# Patient Record
Sex: Female | Born: 1995 | Race: Black or African American | Hispanic: No | Marital: Single | Smoking: Never smoker
Health system: Southern US, Community
[De-identification: ages and names within clinical notes are randomized; demographics above are authoritative.]

---
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
## 2019-08-05 ENCOUNTER — Encounter (HOSPITAL_COMMUNITY): Payer: Self-pay

## 2019-08-05 ENCOUNTER — Other Ambulatory Visit: Payer: Self-pay

## 2019-08-05 ENCOUNTER — Emergency Department (HOSPITAL_COMMUNITY)
Admission: EM | Admit: 2019-08-05 | Discharge: 2019-08-05 | Disposition: A | Discharge status: Home / Self Care | Payer: Medicaid Other | Attending: Emergency Medicine | Admitting: Emergency Medicine

## 2019-08-05 ENCOUNTER — Emergency Department (HOSPITAL_COMMUNITY): Payer: Medicaid Other

## 2019-08-05 DIAGNOSIS — Y999 Unspecified external cause status: Secondary | ICD-10-CM | POA: Insufficient documentation

## 2019-08-05 DIAGNOSIS — Y9389 Activity, other specified: Secondary | ICD-10-CM | POA: Insufficient documentation

## 2019-08-05 DIAGNOSIS — M25511 Pain in right shoulder: Secondary | ICD-10-CM

## 2019-08-05 DIAGNOSIS — Y92810 Car as the place of occurrence of the external cause: Secondary | ICD-10-CM | POA: Insufficient documentation

## 2019-08-05 DIAGNOSIS — S4991XA Unspecified injury of right shoulder and upper arm, initial encounter: Secondary | ICD-10-CM | POA: Insufficient documentation

## 2019-08-05 DIAGNOSIS — S4990XA Unspecified injury of shoulder and upper arm, unspecified arm, initial encounter: Secondary | ICD-10-CM

## 2019-08-05 DIAGNOSIS — X500XXA Overexertion from strenuous movement or load, initial encounter: Secondary | ICD-10-CM | POA: Insufficient documentation

## 2019-08-05 MED ORDER — OXYCODONE-ACETAMINOPHEN 5-325 MG PO TABS
1.0000 | ORAL_TABLET | Freq: Once | ORAL | Status: AC
  Administered 2019-08-05: 1 via ORAL
  Filled 2019-08-05: qty 1

## 2019-08-05 MED ORDER — METHOCARBAMOL 500 MG PO TABS: 500.0000 mg | ORAL_TABLET | Freq: Two times a day (BID) | ORAL | 0 refills | Status: AC

## 2019-08-05 MED ORDER — IBUPROFEN 600 MG PO TABS: 600.0000 mg | ORAL_TABLET | Freq: Four times a day (QID) | ORAL | 0 refills | Status: AC | PRN

## 2019-08-05 MED ORDER — LIDOCAINE 5 % EX PTCH: 1.0000 | MEDICATED_PATCH | CUTANEOUS | 0 refills | Status: AC

## 2019-08-05 MED ORDER — IBUPROFEN 200 MG PO TABS
600.0000 mg | ORAL_TABLET | Freq: Once | ORAL | Status: AC
  Administered 2019-08-05: 08:00:00 600 mg via ORAL
  Filled 2019-08-05: qty 3

## 2019-08-05 NOTE — Discharge Instructions (Addendum)
You have been seen today for a shoulder injury. There were no acute abnormalities on the x-rays, including no sign of fracture or dislocation, however, there could be injuries to the soft tissues, such as the ligaments or tendons that are not seen on xrays. There could also be what are called occult fractures that are small fractures not seen on xray. Antiinflammatory medications: Take 600 mg of ibuprofen every 6 hours or 440 mg (over the counter dose) to 500 mg (prescription dose) of naproxen every 12 hours for the next 3 days. After this time, these medications may be used as needed for pain. Take these medications with food to avoid upset stomach. Choose only one of these medications, do not take them together. Acetaminophen (generic for Tylenol): Should you continue to have additional pain while taking the ibuprofen or naproxen, you may add in acetaminophen as needed. Your daily total maximum amount of acetaminophen from all sources should be limited to 4000mg /day for persons without liver problems, or 2000mg /day for those with liver problems. Methocarbamol: Methocarbamol (generic for Robaxin) is a muscle relaxer and can help relieve stiff muscles or muscle spasms.  Do not drive or perform other dangerous activities while taking this medication as it can cause drowsiness as well as changes in reaction time and judgement. Ice: May apply ice to the area over the next 24 hours for 15 minutes at a time to reduce swelling. Elevation: Keep the extremity elevated as often as possible to reduce pain and inflammation. Support: Wear the sling for support and comfort. Wear this until pain resolves. Be sure to take your arm out of the sling multiple times a day to prevent a condition called frozen shoulder. Follow up: We recommend follow-up with the orthopedic surgeon within the next couple weeks.  Shoulders that have dislocated multiple times become much more likely to dislocate in the future.  Many times the  shoulders require surgical repair. Return: Return to the ED for numbness, weakness, increasing pain, overall worsening symptoms, loss of function, or if symptoms are not improving, you have tried to follow up with the orthopedic specialist, and have been unable to do so.  For prescription assistance, may try using prescription discount sites or apps, such as goodrx.com

## 2019-08-05 NOTE — ED Provider Notes (Signed)
[facility] DEPT Provider Note   CSN: [id number] Arrival date & time: 08/05/19  0715     History Chief Complaint  Patient presents with  . Shoulder Pain    Alexa Harrington is a 24 y.o. female.  HPI      Alexa Harrington is a 24 y.o. female, with a history of reported right shoulder dislocation, presenting to the ED with right shoulder pain beginning shortly prior to arrival.  Patient states she was bending over, reaching into the backseat of her car with her right arm when she began to feel pain in the right shoulder.  She states it feels as though the shoulder felt like it popped out of place, but may have popped back into place.  She endorses history of previous shoulder dislocation. Pain is described as a soreness and a tightness, moderate to severe, in the right shoulder and trapezius.  Denies neck pain, numbness, weakness, chest pain, shortness of breath, or any other complaints.     History reviewed. No pertinent past medical history.  There are no problems to display for this patient.   History reviewed. No pertinent surgical history.   OB History   No obstetric history on file.     Family History  Problem Relation Age of Onset  . Sleep apnea Father     Social History   Tobacco Use  . Smoking status: Never Smoker  . Smokeless tobacco: Never Used  Substance Use Topics  . Alcohol use: Never  . Drug use: Never    Home Medications Prior to Admission medications   Medication Sig Start Date End Date Taking? Authorizing Provider  ibuprofen (ADVIL) 600 MG tablet Take 1 tablet (600 mg total) by mouth every 6 (six) hours as needed. 08/05/19   Danna Sewell C, PA-C  lidocaine (LIDODERM) 5 % Place 1 patch onto the skin daily. Remove & Discard patch within 12 hours or as directed by MD 08/05/19   Haedyn Ancrum C, PA-C  methocarbamol (ROBAXIN) 500 MG tablet Take 1 tablet (500 mg total) by mouth 2 (two) times daily. 08/05/19   Jalayla Chrismer, Helane Gunther, PA-C     Allergies    Patient has no known allergies.  Review of Systems   Review of Systems  Musculoskeletal: Positive for arthralgias.  Neurological: Negative for weakness and numbness.    Physical Exam Updated Vital Signs BP 117/67 (BP Location: Left Arm)   Pulse 95   Temp 98.2 F (36.8 C) (Oral)   Resp 16   Ht 5\' 4"  (1.626 m)   Wt 62.1 kg   LMP 08/05/2019   SpO2 100%   BMI 23.52 kg/m   Physical Exam Vitals and nursing note reviewed.  Constitutional:      General: She is not in acute distress.    Appearance: She is well-developed. She is not diaphoretic.  HENT:     Head: Normocephalic and atraumatic.  Eyes:     Conjunctiva/sclera: Conjunctivae normal.  Cardiovascular:     Rate and Rhythm: Normal rate and regular rhythm.     Pulses: Normal pulses.          Radial pulses are 2+ on the right side.  Pulmonary:     Effort: Pulmonary effort is normal.  Musculoskeletal:     Cervical back: Neck supple.     Comments: Tenderness to the right posterior shoulder, right upper back musculature, and right trapezius.  No deformity, swelling, or instability noted. Upon initial exam, there is a surprising amount  of spontaneous movement to the right shoulder that patient is able to demonstrate.  She is observed to be able to pull her right arm out of the sleeve of her shirt without assistance, she is able to reach across her body and pull at the left arm of her shirt. She individually uses both arms to direct her hands through the sleeves of her gown.  Skin:    General: Skin is warm and dry.     Coloration: Skin is not pale.  Neurological:     Mental Status: She is alert.     Comments: Sensation grossly intact to light touch through each of the nerve distributions of the bilateral upper extremities. Abduction and adduction of the fingers intact against resistance. Grip strength equal bilaterally. Supination and pronation intact against resistance. Strength 5/5 through the cardinal  directions of the bilateral wrists. Strength 5/5 with flexion and extension of the bilateral elbows. Patient can touch the thumb to each one of the fingertips without difficulty.  Patient can hold the "OK" sign against resistance.  Psychiatric:        Behavior: Behavior normal.     ED Results / Procedures / Treatments   Labs (all labs ordered are listed, but only abnormal results are displayed) Labs Reviewed - No data to display  EKG None  Radiology DG Shoulder Right Port  Result Date: 08/05/2019 CLINICAL DATA:  Possible dislocation EXAM: PORTABLE RIGHT SHOULDER COMPARISON:  None. FINDINGS: Alignment is anatomic.  No acute fracture.  Joint space is preserved IMPRESSION: No acute fracture or malalignment Electronically Signed   By: Guadlupe Spanish M.D.   On: 08/05/2019 08:13    Procedures Procedures (including critical care time)  Medications Ordered in ED Medications  oxyCODONE-acetaminophen (PERCOCET/ROXICET) 5-325 MG per tablet 1 tablet (1 tablet Oral Given 08/05/19 0814)  ibuprofen (ADVIL) tablet 600 mg (600 mg Oral Given 08/05/19 0947)    ED Course  I have reviewed the triage vital signs and the nursing notes.  Pertinent labs & imaging results that were available during my care of the patient were reviewed by me and considered in my medical decision making (see chart for details).    MDM Rules/Calculators/A&P                      Patient presents with pain to the right shoulder.  She states she feels as though her shoulder dislocated, but she may have reduced it prior to arrival. She has no evidence of neurovascular compromise. I personally reviewed and interpreted her x-rays.  She has no evidence of dislocation or other acute abnormality on x-ray. She has adequate range of motion in the shoulder.  She was advised to follow-up with orthopedics on this matter. The patient was given instructions for home care as well as return precautions. Patient voices understanding of  these instructions, accepts the plan, and is comfortable with discharge.    Final Clinical Impression(s) / ED Diagnoses Final diagnoses:  Shoulder injury  Acute pain of right shoulder    Rx / DC Orders ED Discharge Orders         Ordered    methocarbamol (ROBAXIN) 500 MG tablet  2 times daily     08/05/19 0856    ibuprofen (ADVIL) 600 MG tablet  Every 6 hours PRN     08/05/19 0856    lidocaine (LIDODERM) 5 %  Every 24 hours     08/05/19 0856  Anselm Pancoast, PA-C 08/05/19 0901    Tilden Fossa, MD 08/05/19 [phone]

## 2019-08-05 NOTE — ED Triage Notes (Signed)
Patient states she was getting something out of her car and it feels like it is dislocated." Patient states this has happened years prior . Patient reports doing a lot of lifting at work.

## 2020-09-27 IMAGING — DX DG SHOULDER 2+V PORT*R*
2 series · 2 of 2 positions shown · non-contrast
Comparison: None.

CLINICAL DATA: Possible dislocation

EXAM:
PORTABLE RIGHT SHOULDER

[shoulder ap]
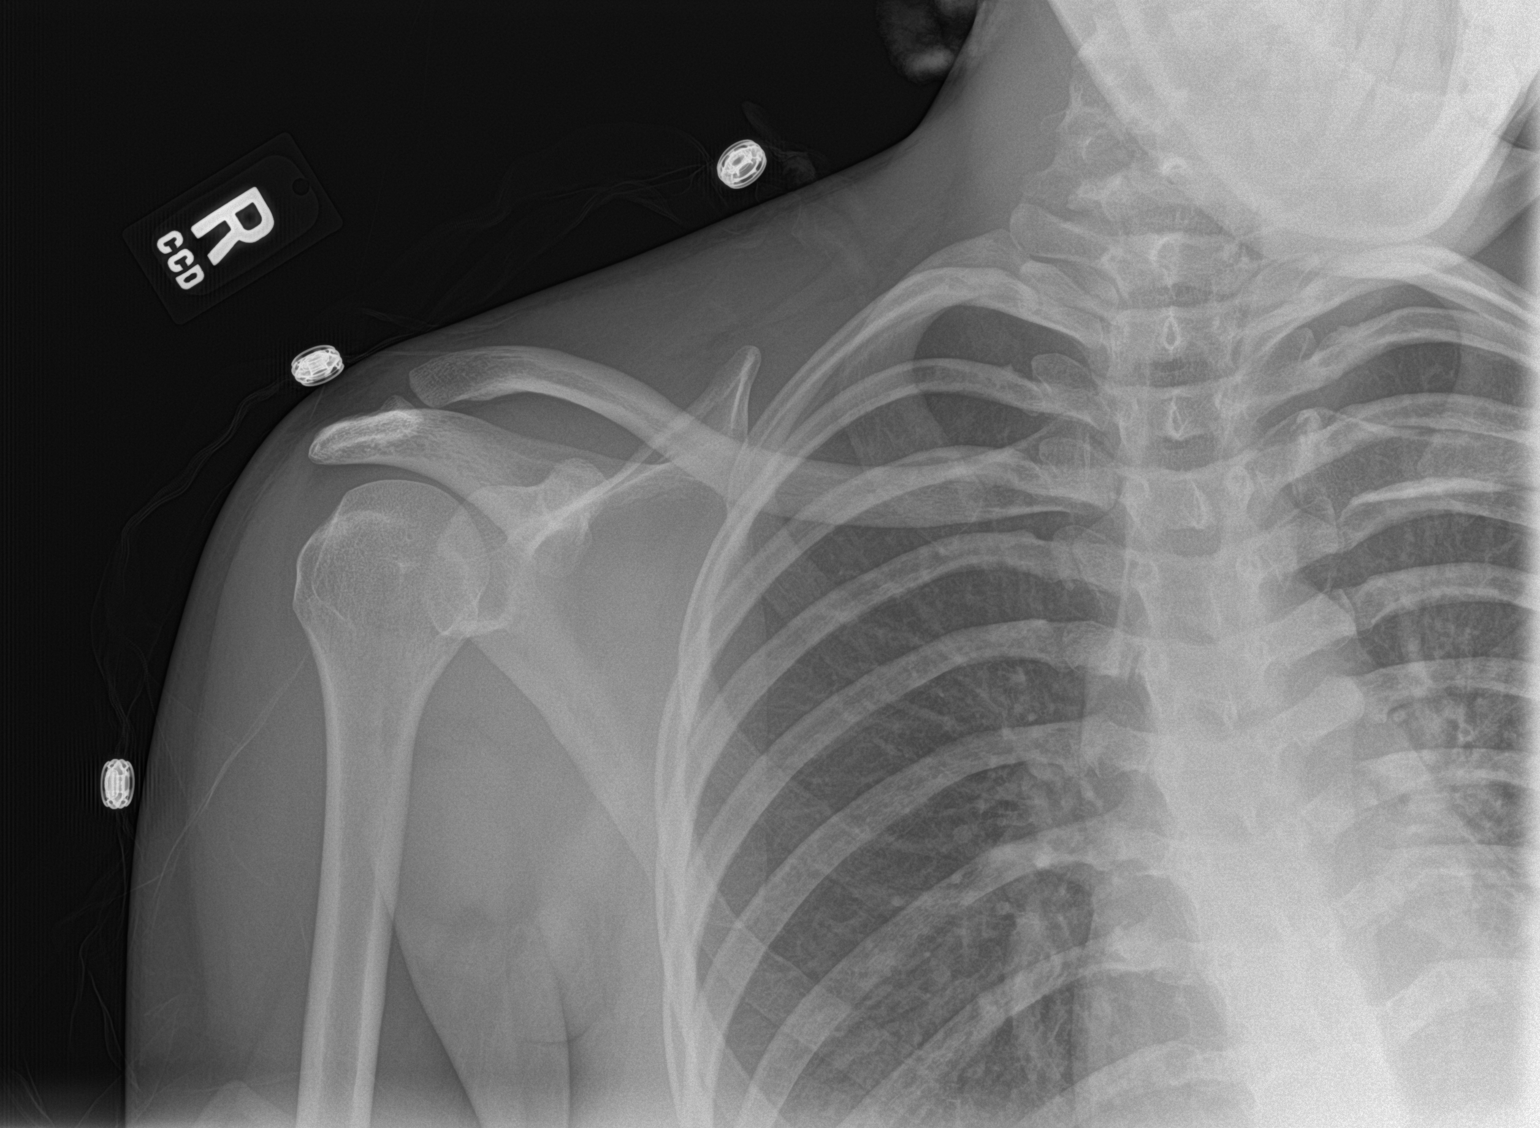

[shoulder obl]
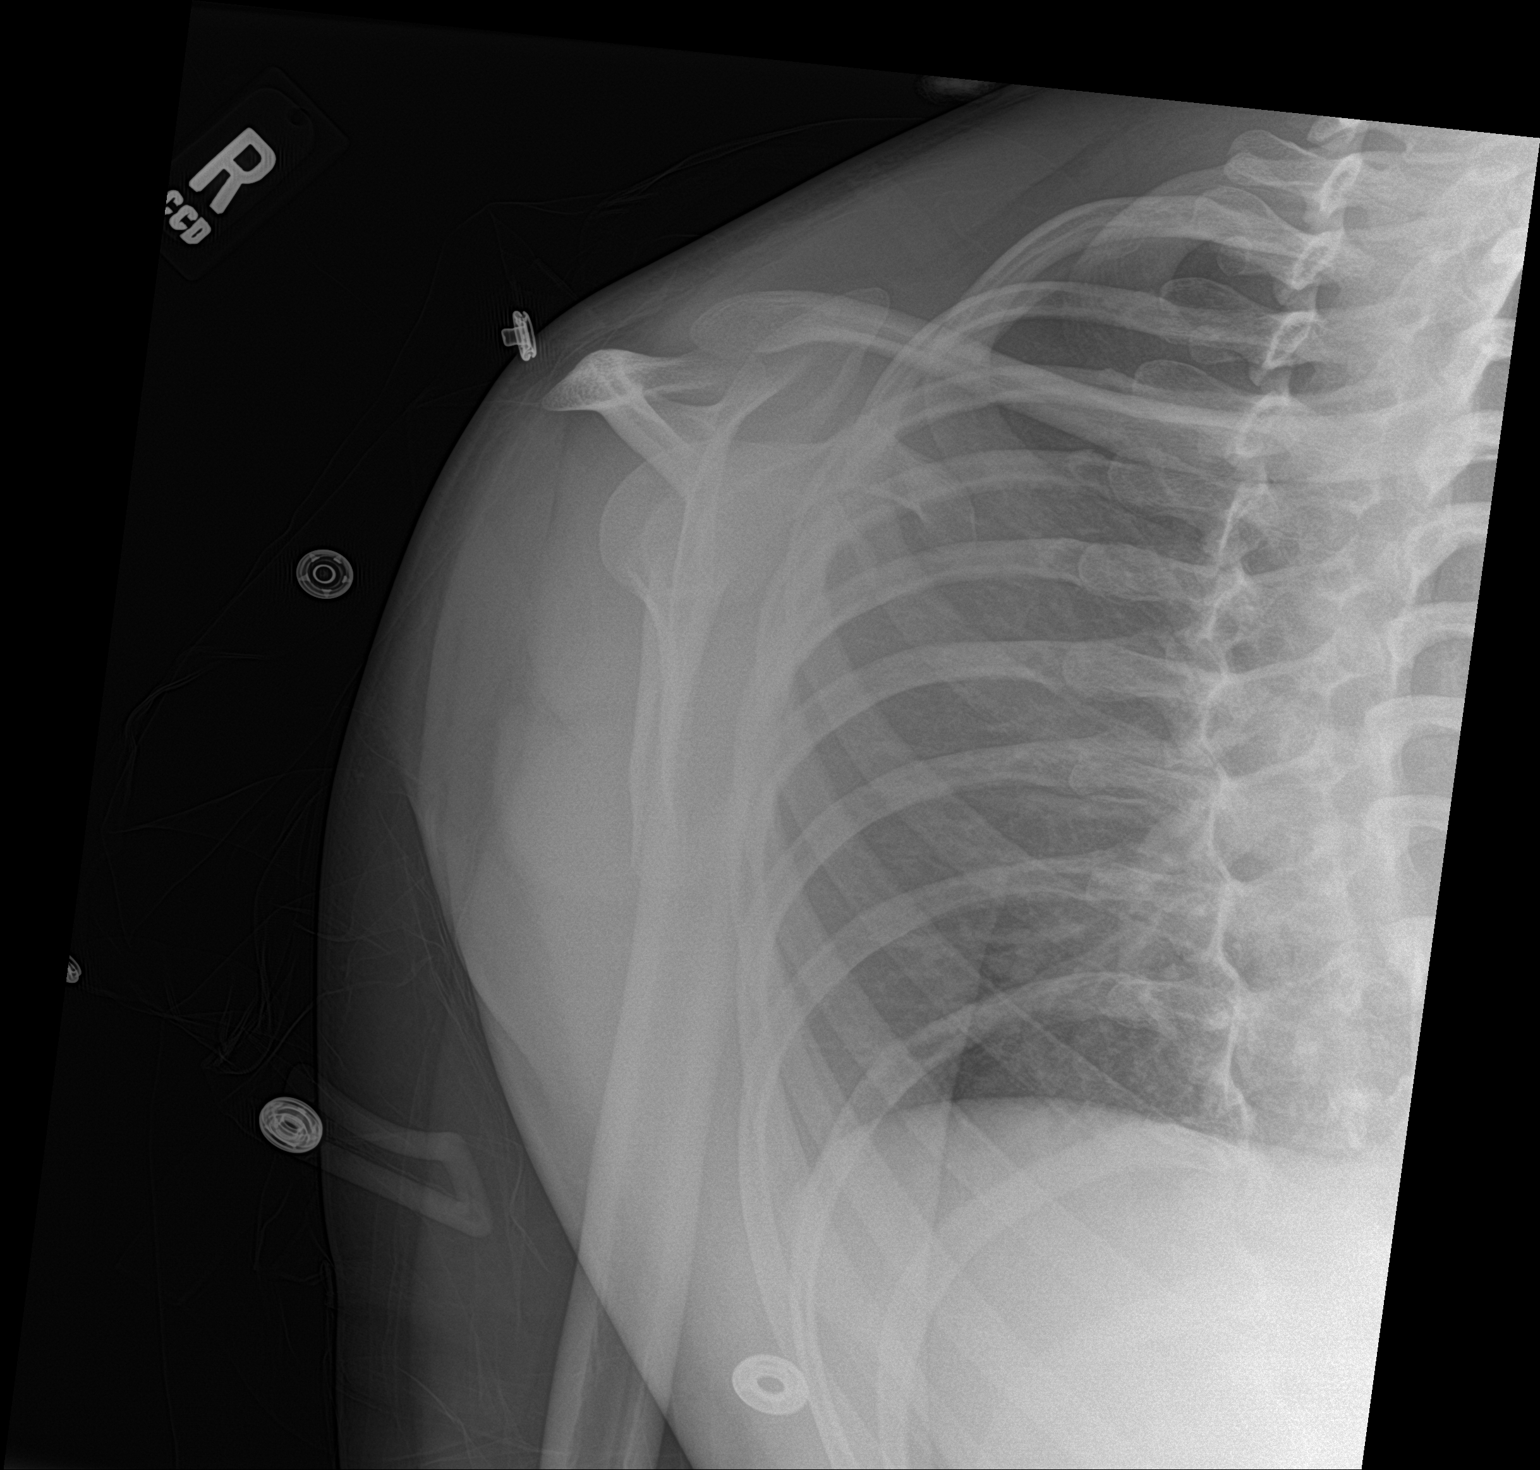

[2 of 2 positions shown; findings below may reference images not displayed]

FINDINGS: Alignment is anatomic.  No acute fracture.  Joint space is preserved
IMPRESSION: No acute fracture or malalignment

## 2022-12-28 DIAGNOSIS — Z3A20 20 weeks gestation of pregnancy: Secondary | ICD-10-CM | POA: Diagnosis not present

## 2022-12-28 DIAGNOSIS — O0932 Supervision of pregnancy with insufficient antenatal care, second trimester: Secondary | ICD-10-CM | POA: Diagnosis not present

## 2023-02-13 DEATH — deceased
